# Patient Record
Sex: Male | Born: 1940 | Race: White | Hispanic: No | Marital: Married | Smoking: Former smoker
Health system: Southern US, Community
[De-identification: ages and names within clinical notes are randomized; demographics above are authoritative.]

## PROBLEM LIST (undated history)

## (undated) DIAGNOSIS — S060X9A Concussion with loss of consciousness of unspecified duration, initial encounter: Secondary | ICD-10-CM

## (undated) DIAGNOSIS — G4730 Sleep apnea, unspecified: Secondary | ICD-10-CM

## (undated) DIAGNOSIS — R519 Headache, unspecified: Secondary | ICD-10-CM

## (undated) DIAGNOSIS — Z8719 Personal history of other diseases of the digestive system: Secondary | ICD-10-CM

## (undated) DIAGNOSIS — J189 Pneumonia, unspecified organism: Secondary | ICD-10-CM

## (undated) DIAGNOSIS — R51 Headache: Secondary | ICD-10-CM

## (undated) DIAGNOSIS — C801 Malignant (primary) neoplasm, unspecified: Secondary | ICD-10-CM

## (undated) DIAGNOSIS — I10 Essential (primary) hypertension: Secondary | ICD-10-CM

## (undated) DIAGNOSIS — K219 Gastro-esophageal reflux disease without esophagitis: Secondary | ICD-10-CM

## (undated) HISTORY — PX: COLON SURGERY: SHX602

---

## 1972-09-17 DIAGNOSIS — S060X1A Concussion with loss of consciousness of 30 minutes or less, initial encounter: Secondary | ICD-10-CM

## 1972-09-17 DIAGNOSIS — S060X9A Concussion with loss of consciousness of unspecified duration, initial encounter: Secondary | ICD-10-CM

## 1972-09-17 HISTORY — DX: Concussion with loss of consciousness of 30 minutes or less, initial encounter: S06.0X1A

## 1972-09-17 HISTORY — DX: Concussion with loss of consciousness of unspecified duration, initial encounter: S06.0X9A

## 2014-09-22 ENCOUNTER — Inpatient Hospital Stay (HOSPITAL_COMMUNITY)
Admission: EM | Admit: 2014-09-22 | Discharge: 2014-09-24 | DRG: 864 | Disposition: A | Discharge status: Home / Self Care | Payer: Medicare Other | Attending: Internal Medicine | Admitting: Internal Medicine

## 2014-09-22 ENCOUNTER — Emergency Department (HOSPITAL_COMMUNITY): Payer: Medicare Other

## 2014-09-22 ENCOUNTER — Encounter (HOSPITAL_COMMUNITY): Payer: Self-pay

## 2014-09-22 ENCOUNTER — Inpatient Hospital Stay (HOSPITAL_COMMUNITY): Payer: Medicare Other

## 2014-09-22 DIAGNOSIS — R6510 Systemic inflammatory response syndrome (SIRS) of non-infectious origin without acute organ dysfunction: Secondary | ICD-10-CM | POA: Diagnosis present

## 2014-09-22 DIAGNOSIS — N400 Enlarged prostate without lower urinary tract symptoms: Secondary | ICD-10-CM | POA: Diagnosis present

## 2014-09-22 DIAGNOSIS — Z7982 Long term (current) use of aspirin: Secondary | ICD-10-CM | POA: Diagnosis not present

## 2014-09-22 DIAGNOSIS — R5383 Other fatigue: Secondary | ICD-10-CM

## 2014-09-22 DIAGNOSIS — Z87891 Personal history of nicotine dependence: Secondary | ICD-10-CM

## 2014-09-22 DIAGNOSIS — G4730 Sleep apnea, unspecified: Secondary | ICD-10-CM | POA: Diagnosis present

## 2014-09-22 DIAGNOSIS — G8220 Paraplegia, unspecified: Secondary | ICD-10-CM | POA: Diagnosis present

## 2014-09-22 DIAGNOSIS — Z859 Personal history of malignant neoplasm, unspecified: Secondary | ICD-10-CM

## 2014-09-22 DIAGNOSIS — G9340 Encephalopathy, unspecified: Secondary | ICD-10-CM | POA: Diagnosis present

## 2014-09-22 DIAGNOSIS — I10 Essential (primary) hypertension: Secondary | ICD-10-CM | POA: Diagnosis present

## 2014-09-22 DIAGNOSIS — R509 Fever, unspecified: Principal | ICD-10-CM | POA: Diagnosis present

## 2014-09-22 DIAGNOSIS — A419 Sepsis, unspecified organism: Secondary | ICD-10-CM

## 2014-09-22 DIAGNOSIS — K219 Gastro-esophageal reflux disease without esophagitis: Secondary | ICD-10-CM | POA: Diagnosis present

## 2014-09-22 DIAGNOSIS — D72829 Elevated white blood cell count, unspecified: Secondary | ICD-10-CM

## 2014-09-22 DIAGNOSIS — R05 Cough: Secondary | ICD-10-CM | POA: Diagnosis present

## 2014-09-22 DIAGNOSIS — E780 Pure hypercholesterolemia: Secondary | ICD-10-CM | POA: Diagnosis present

## 2014-09-22 DIAGNOSIS — G039 Meningitis, unspecified: Secondary | ICD-10-CM | POA: Diagnosis present

## 2014-09-22 DIAGNOSIS — R410 Disorientation, unspecified: Secondary | ICD-10-CM

## 2014-09-22 HISTORY — DX: Pneumonia, unspecified organism: J18.9

## 2014-09-22 HISTORY — DX: Malignant (primary) neoplasm, unspecified: C80.1

## 2014-09-22 HISTORY — DX: Personal history of other diseases of the digestive system: Z87.19

## 2014-09-22 HISTORY — DX: Essential (primary) hypertension: I10

## 2014-09-22 HISTORY — DX: Headache, unspecified: R51.9

## 2014-09-22 HISTORY — DX: Concussion with loss of consciousness of unspecified duration, initial encounter: S06.0X9A

## 2014-09-22 HISTORY — DX: Gastro-esophageal reflux disease without esophagitis: K21.9

## 2014-09-22 HISTORY — DX: Headache: R51

## 2014-09-22 HISTORY — DX: Sleep apnea, unspecified: G47.30

## 2014-09-22 LAB — COMPREHENSIVE METABOLIC PANEL
ALBUMIN: 3.8 g/dL (ref 3.5–5.2)
ALT: 18 U/L (ref 0–53)
AST: 23 U/L (ref 0–37)
Alkaline Phosphatase: 68 U/L (ref 39–117)
Anion gap: 9 (ref 5–15)
BUN: 18 mg/dL (ref 6–23)
CALCIUM: 9 mg/dL (ref 8.4–10.5)
CHLORIDE: 103 meq/L (ref 96–112)
CO2: 24 mmol/L (ref 19–32)
CREATININE: 0.92 mg/dL (ref 0.50–1.35)
GFR calc Af Amer: >90 mL/min (ref >90)
GFR, EST NON AFRICAN AMERICAN: 80 mL/min — AB (ref >90)
GLUCOSE: 107 mg/dL — AB (ref 70–99)
Potassium: 4.1 mmol/L (ref 3.5–5.1)
Sodium: 136 mmol/L (ref 135–145)
Total Bilirubin: 1.7 mg/dL — ABNORMAL HIGH (ref 0.3–1.2)
Total Protein: 6.3 g/dL (ref 6.0–8.3)

## 2014-09-22 LAB — DIFFERENTIAL
BASOS ABS: 0 10*3/uL (ref 0.0–0.1)
Basophils Relative: 0 % (ref 0–1)
EOS PCT: 0 % (ref 0–5)
Eosinophils Absolute: 0 10*3/uL (ref 0.0–0.7)
LYMPHS PCT: 4 % — AB (ref 12–46)
Lymphs Abs: 1 10*3/uL (ref 0.7–4.0)
MONOS PCT: 9 % (ref 3–12)
Monocytes Absolute: 2 10*3/uL — ABNORMAL HIGH (ref 0.1–1.0)
NEUTROS ABS: 20 10*3/uL — AB (ref 1.7–7.7)
Neutrophils Relative %: 87 % — ABNORMAL HIGH (ref 43–77)

## 2014-09-22 LAB — URINALYSIS, ROUTINE W REFLEX MICROSCOPIC
BILIRUBIN URINE: NEGATIVE
Glucose, UA: NEGATIVE mg/dL
Ketones, ur: NEGATIVE mg/dL
Leukocytes, UA: NEGATIVE
NITRITE: NEGATIVE
Protein, ur: NEGATIVE mg/dL
SPECIFIC GRAVITY, URINE: 1.025 (ref 1.005–1.030)
Urobilinogen, UA: 1 mg/dL (ref 0.0–1.0)
pH: 5.5 (ref 5.0–8.0)

## 2014-09-22 LAB — CBG MONITORING, ED: Glucose-Capillary: 97 mg/dL (ref 70–99)

## 2014-09-22 LAB — CSF CELL COUNT WITH DIFFERENTIAL
RBC COUNT CSF: 4 /mm3 — AB
Tube #: 1
WBC, CSF: 2 /mm3 (ref 0–5)

## 2014-09-22 LAB — I-STAT CG4 LACTIC ACID, ED: Lactic Acid, Venous: 0.97 mmol/L (ref 0.5–2.2)

## 2014-09-22 LAB — CBC
HCT: 46.4 % (ref 39.0–52.0)
HEMOGLOBIN: 15.7 g/dL (ref 13.0–17.0)
MCH: 33 pg (ref 26.0–34.0)
MCHC: 33.8 g/dL (ref 30.0–36.0)
MCV: 97.5 fL (ref 78.0–100.0)
PLATELETS: 185 10*3/uL (ref 150–400)
RBC: 4.76 MIL/uL (ref 4.22–5.81)
RDW: 14.3 % (ref 11.5–15.5)
WBC: 23 10*3/uL — AB (ref 4.0–10.5)

## 2014-09-22 LAB — ETHANOL

## 2014-09-22 LAB — PROTIME-INR
INR: 1.24 (ref 0.00–1.49)
Prothrombin Time: 15.7 seconds — ABNORMAL HIGH (ref 11.6–15.2)

## 2014-09-22 LAB — RAPID URINE DRUG SCREEN, HOSP PERFORMED
Amphetamines: NOT DETECTED
BARBITURATES: NOT DETECTED
Benzodiazepines: NOT DETECTED
Cocaine: NOT DETECTED
Opiates: NOT DETECTED
TETRAHYDROCANNABINOL: NOT DETECTED

## 2014-09-22 LAB — I-STAT TROPONIN, ED: Troponin i, poc: 0 ng/mL (ref 0.00–0.08)

## 2014-09-22 LAB — GLUCOSE, CSF: Glucose, CSF: 69 mg/dL (ref 43–76)

## 2014-09-22 LAB — APTT: aPTT: 33 seconds (ref 24–37)

## 2014-09-22 LAB — URINE MICROSCOPIC-ADD ON

## 2014-09-22 LAB — PROTEIN, CSF: TOTAL PROTEIN, CSF: 26 mg/dL (ref 15–45)

## 2014-09-22 MED ORDER — HEPARIN SODIUM (PORCINE) 5000 UNIT/ML IJ SOLN
5000.0000 [IU] | Freq: Three times a day (TID) | INTRAMUSCULAR | Status: DC
  Administered 2014-09-22 – 2014-09-24 (×5): 5000 [IU] via SUBCUTANEOUS
  Filled 2014-09-22 (×9): qty 1

## 2014-09-22 MED ORDER — VANCOMYCIN HCL IN DEXTROSE 1-5 GM/200ML-% IV SOLN: 1000.0000 mg | Freq: Once | INTRAVENOUS | Status: DC

## 2014-09-22 MED ORDER — ACETAMINOPHEN 325 MG PO TABS
650.0000 mg | ORAL_TABLET | Freq: Once | ORAL | Status: AC
  Administered 2014-09-22: 650 mg via ORAL
  Filled 2014-09-22: qty 2

## 2014-09-22 MED ORDER — PIPERACILLIN-TAZOBACTAM 3.375 G IVPB 30 MIN: 3.3750 g | INTRAVENOUS | Status: DC

## 2014-09-22 MED ORDER — SODIUM CHLORIDE 0.9 % IV SOLN
INTRAVENOUS | Status: DC
  Administered 2014-09-22 – 2014-09-23 (×3): via INTRAVENOUS

## 2014-09-22 MED ORDER — MORPHINE SULFATE 2 MG/ML IJ SOLN: 2.0000 mg | INTRAMUSCULAR | Status: DC | PRN

## 2014-09-22 MED ORDER — BACID PO TABS
2.0000 | ORAL_TABLET | Freq: Two times a day (BID) | ORAL | Status: DC
  Administered 2014-09-22: 2 via ORAL
  Filled 2014-09-22 (×5): qty 2

## 2014-09-22 MED ORDER — ACETAMINOPHEN 325 MG PO TABS
650.0000 mg | ORAL_TABLET | Freq: Four times a day (QID) | ORAL | Status: DC | PRN
  Administered 2014-09-22: 650 mg via ORAL
  Filled 2014-09-22: qty 2

## 2014-09-22 MED ORDER — SODIUM CHLORIDE 0.9 % IV SOLN
INTRAVENOUS | Status: DC
  Administered 2014-09-22: 07:00:00 via INTRAVENOUS

## 2014-09-22 MED ORDER — VANCOMYCIN HCL IN DEXTROSE 750-5 MG/150ML-% IV SOLN
750.0000 mg | Freq: Two times a day (BID) | INTRAVENOUS | Status: DC
  Administered 2014-09-23: 750 mg via INTRAVENOUS
  Filled 2014-09-22 (×2): qty 150

## 2014-09-22 MED ORDER — SODIUM CHLORIDE 0.9 % IV SOLN
2000.0000 mg | Freq: Once | INTRAVENOUS | Status: AC
  Administered 2014-09-22: 2000 mg via INTRAVENOUS
  Filled 2014-09-22: qty 2000

## 2014-09-22 MED ORDER — ALFUZOSIN HCL ER 10 MG PO TB24
10.0000 mg | ORAL_TABLET | Freq: Every day | ORAL | Status: DC
  Administered 2014-09-22 – 2014-09-23 (×2): 10 mg via ORAL
  Filled 2014-09-22 (×3): qty 1

## 2014-09-22 MED ORDER — INFLUENZA VAC SPLIT QUAD 0.5 ML IM SUSY
0.5000 mL | PREFILLED_SYRINGE | INTRAMUSCULAR | Status: AC
  Administered 2014-09-23: 0.5 mL via INTRAMUSCULAR
  Filled 2014-09-22 (×2): qty 0.5

## 2014-09-22 MED ORDER — PIPERACILLIN-TAZOBACTAM 3.375 G IVPB 30 MIN
3.3750 g | Freq: Once | INTRAVENOUS | Status: DC
  Filled 2014-09-22: qty 50

## 2014-09-22 MED ORDER — ATORVASTATIN CALCIUM 20 MG PO TABS
20.0000 mg | ORAL_TABLET | Freq: Every day | ORAL | Status: DC
  Administered 2014-09-22 – 2014-09-23 (×2): 20 mg via ORAL
  Filled 2014-09-22 (×3): qty 1

## 2014-09-22 MED ORDER — ASPIRIN 81 MG PO CHEW
81.0000 mg | CHEWABLE_TABLET | Freq: Every day | ORAL | Status: DC
  Administered 2014-09-22 – 2014-09-23 (×2): 81 mg via ORAL
  Filled 2014-09-22 (×3): qty 1

## 2014-09-22 MED ORDER — SODIUM CHLORIDE 0.9 % IJ SOLN: 3.0000 mL | Freq: Two times a day (BID) | INTRAMUSCULAR | Status: DC

## 2014-09-22 MED ORDER — ACETAMINOPHEN 650 MG RE SUPP: 650.0000 mg | Freq: Four times a day (QID) | RECTAL | Status: DC | PRN

## 2014-09-22 MED ORDER — ONDANSETRON HCL 4 MG PO TABS: 4.0000 mg | ORAL_TABLET | Freq: Four times a day (QID) | ORAL | Status: DC | PRN

## 2014-09-22 MED ORDER — SODIUM CHLORIDE 0.9 % IV BOLUS (SEPSIS)
500.0000 mL | Freq: Once | INTRAVENOUS | Status: AC
  Administered 2014-09-22: 500 mL via INTRAVENOUS

## 2014-09-22 MED ORDER — ONDANSETRON HCL 4 MG/2ML IJ SOLN
4.0000 mg | Freq: Four times a day (QID) | INTRAMUSCULAR | Status: DC | PRN
  Administered 2014-09-22: 4 mg via INTRAVENOUS
  Filled 2014-09-22: qty 2

## 2014-09-22 MED ORDER — PANTOPRAZOLE SODIUM 40 MG PO TBEC
40.0000 mg | DELAYED_RELEASE_TABLET | Freq: Every day | ORAL | Status: DC
  Administered 2014-09-23 – 2014-09-24 (×2): 40 mg via ORAL
  Filled 2014-09-22 (×2): qty 1

## 2014-09-22 MED ORDER — PIPERACILLIN-TAZOBACTAM 3.375 G IVPB
3.3750 g | Freq: Three times a day (TID) | INTRAVENOUS | Status: DC
  Administered 2014-09-22 – 2014-09-24 (×7): 3.375 g via INTRAVENOUS
  Filled 2014-09-22 (×8): qty 50

## 2014-09-22 NOTE — ED Notes (Signed)
Patient from [place] Patient driving to [facility] from [place] Patient's family brought patient in via private car due to [place] Patient has not been seen in over two weeks Family member states that patient had slurred speech, so they brought him in

## 2014-09-22 NOTE — ED Notes (Signed)
Patient found standing in room, beside bed.  When asked what he was doing, he replied, "I had to throw up."  Moderate amount of green/yellow emesis noted on linens and gown.  Denies nausea at this time.  Patient cleaned and redirected toward bed.  Reoriented to call light.

## 2014-09-22 NOTE — Progress Notes (Signed)
UR completed 

## 2014-09-22 NOTE — ED Notes (Signed)
Pt reports was coming to visit brothers and became disoriented. Pt reports here to figure out what is wrong.

## 2014-09-22 NOTE — ED Provider Notes (Signed)
CSN: [id number]     Arrival date & time 09/22/14  0246 History   First MD Initiated Contact with Patient 09/22/14 0251     Chief Complaint  Patient presents with  . Altered Mental Status  . Fever     (Consider location/radiation/quality/duration/timing/severity/associated sxs/prior Treatment) HPI Patient presents with confusion. According to patient's brother, he was called by [facility] police after being found in the parking lot of a [employer]. He thought that he was at a hotel. He is noted to have mild slurred speech. Recently drove down from [place]. Patient's brother states he saw the patient several weeks ago and he was acting normally. No history of confusion. Patient denies any pain at this time. Specifically no chest pain or abdominal pain. He has no urinary symptoms. No nausea, vomiting or diarrhea. No neck pain or stiffness. Patient has no focal weakness or numbness. Past Medical History  Diagnosis Date  . Hypertension   . Pneumonia   . GERD (gastroesophageal reflux disease)   . History of hiatal hernia   . Headache   . Cancer     colon cancer  . Sleep apnea     uses cpap   Past Surgical History  Procedure Laterality Date  . Colon surgery     History reviewed. No pertinent family history. History  Substance Use Topics  . Smoking status: Former Smoker    Types: Cigars    Quit date: 09/17/1993  . Smokeless tobacco: Not on file  . Alcohol Use: 34.8 oz/week    58 Shots of liquor per week    Review of Systems  Constitutional: Positive for fever.  Respiratory: Negative for shortness of breath.   Cardiovascular: Negative for chest pain.  Gastrointestinal: Negative for nausea, vomiting and abdominal pain.  Genitourinary: Negative for dysuria and flank pain.  Musculoskeletal: Negative for back pain, neck pain and neck stiffness.  Skin: Negative for rash and wound.  Neurological: Negative for dizziness, weakness, light-headedness, numbness and headaches.   Psychiatric/Behavioral: Positive for confusion.  All other systems reviewed and are negative.     Allergies  Review of patient's allergies indicates no known allergies.  Home Medications   Prior to Admission medications   Medication Sig Start Date End Date Taking? Authorizing Provider  alfuzosin (UROXATRAL) 10 MG 24 hr tablet Take 10 mg by mouth at bedtime.   Yes Historical Provider, MD  Armodafinil (NUVIGIL) 150 MG tablet Take 150 mg by mouth at bedtime.   Yes Historical Provider, MD  aspirin 325 MG tablet Take 325 mg by mouth once.   Yes Historical Provider, MD  aspirin 81 MG chewable tablet Chew 81 mg by mouth at bedtime.   Yes Historical Provider, MD  atorvastatin (LIPITOR) 20 MG tablet Take 20 mg by mouth at bedtime.   Yes Historical Provider, MD  azelastine (ASTELIN) 0.1 % nasal spray Place 2 sprays into both nostrils 2 (two) times daily. Use in each nostril as directed   Yes Historical Provider, MD  dutasteride (AVODART) 0.5 MG capsule Take 0.5 mg by mouth at bedtime.   Yes Historical Provider, MD  esomeprazole (NEXIUM) 40 MG capsule Take 40 mg by mouth daily at 12 noon.   Yes Historical Provider, MD  lactobacillus acidophilus (BACID) TABS tablet Take 2 tablets by mouth 2 (two) times daily.   Yes Historical Provider, MD  LECITHIN PO Take 1 tablet by mouth daily.   Yes Historical Provider, MD  montelukast (SINGULAIR) 10 MG tablet Take 10 mg by mouth at bedtime.  Yes Historical Provider, MD  Multiple Vitamin (MULTIVITAMIN WITH MINERALS) TABS tablet Take 1 tablet by mouth daily.   Yes Historical Provider, MD  nortriptyline (PAMELOR) 50 MG capsule Take 50 mg by mouth at bedtime.   Yes Historical Provider, MD  omega-3 acid ethyl esters (LOVAZA) 1 G capsule Take 1 g by mouth daily.   Yes Historical Provider, MD  OVER THE COUNTER MEDICATION Take 1 tablet by mouth daily. Force factor   Yes Historical Provider, MD  polycarbophil (FIBERCON) 625 MG tablet Take 625 mg by mouth daily.   Yes  Historical Provider, MD  Pseudoephedrine-APAP-DM (DAYQUIL PO) Take 2 capsules by mouth every 4 (four) hours as needed (cold symptoms).   Yes Historical Provider, MD  rOPINIRole (REQUIP) 1 MG tablet Take 1 mg by mouth at bedtime.   Yes Historical Provider, MD  sildenafil (VIAGRA) 100 MG tablet Take 100 mg by mouth daily as needed for erectile dysfunction.   Yes Historical Provider, MD  testosterone cypionate (DEPOTESTOTERONE CYPIONATE) 100 MG/ML injection Inject 200 mg into the muscle every 7 (seven) days. On Sat. For IM use only   Yes Historical Provider, MD  vitamin B-12 (CYANOCOBALAMIN) 1000 MCG tablet Take 1,000 mcg by mouth daily.   Yes Historical Provider, MD   BP 100/53 mmHg  Pulse 59  Temp(Src) 97.7 F (36.5 C) (Oral)  Resp 20  Ht 6\' 2"  (1.88 m)  Wt 227 lb (102.967 kg)  BMI 29.13 kg/m2  SpO2 91% Physical Exam  Constitutional: He is oriented to person, place, and time. He appears well-developed and well-nourished. No distress.  HENT:  Head: Normocephalic and atraumatic.  Mouth/Throat: Oropharynx is clear and moist. No oropharyngeal exudate.  Eyes: EOM are normal. Pupils are equal, round, and reactive to light.  Neck: Normal range of motion. Neck supple.  No meningismus  Cardiovascular: Normal rate and regular rhythm.  Exam reveals no gallop and no friction rub.   No murmur heard. Pulmonary/Chest: Effort normal and breath sounds normal. No respiratory distress. He has no wheezes. He has no rales. He exhibits no tenderness.  Abdominal: Soft. Bowel sounds are normal. He exhibits no distension and no mass. There is no tenderness. There is no rebound and no guarding.  Musculoskeletal: Normal range of motion. He exhibits no edema or tenderness.  Neurological: He is alert and oriented to person, place, and time.  Patient is alert and oriented x3 with clear, goal oriented speech. Patient has 5/5 motor in all extremities. Sensation is intact to light touch.   Skin: Skin is warm and dry.  No rash noted. No erythema.  Psychiatric: He has a normal mood and affect.  Patient with tangential speech pattern.   Nursing note and vitals reviewed.   ED Course  LUMBAR PUNCTURE Date/Time: 09/22/2014 5:42 AM Performed by: Julianne Rice Authorized by: Julianne Rice Consent: Written consent obtained. Risks and benefits: risks, benefits and alternatives were discussed Time out: Immediately prior to procedure a "time out" was called to verify the correct patient, procedure, equipment, support staff and site/side marked as required. Indications: evaluation for infection and evaluation for altered mental status Local anesthetic: lidocaine 1% without epinephrine Anesthetic total: 4 ml Patient sedated: no Lumbar space: L3-L4 interspace Patient's position: left lateral decubitus Needle gauge: 20 Needle type: spinal needle - Quincke tip Needle length: 5.0 in Number of attempts: 3 Post-procedure: site cleaned and adhesive bandage applied Patient tolerance: Patient tolerated the procedure well with no immediate complications Comments: Landmarks difficult to palpate. Unsuccessful lumbar puncture   (  including critical care time) Labs Review Labs Reviewed  PROTIME-INR - Abnormal; Notable for the following:    Prothrombin Time 15.7 (*)    All other components within normal limits  CBC - Abnormal; Notable for the following:    WBC 23.0 (*)    All other components within normal limits  DIFFERENTIAL - Abnormal; Notable for the following:    Neutrophils Relative % 87 (*)    Neutro Abs 20.0 (*)    Lymphocytes Relative 4 (*)    Monocytes Absolute 2.0 (*)    All other components within normal limits  COMPREHENSIVE METABOLIC PANEL - Abnormal; Notable for the following:    Glucose, Bld 107 (*)    Total Bilirubin 1.7 (*)    GFR calc non Af Amer 80 (*)    All other components within normal limits  URINALYSIS, ROUTINE W REFLEX MICROSCOPIC - Abnormal; Notable for the following:    Color,  Urine AMBER (*)    Hgb urine dipstick SMALL (*)    All other components within normal limits  CSF CELL COUNT WITH DIFFERENTIAL - Abnormal; Notable for the following:    Color, CSF CLEAR (*)    Appearance, CSF COLORLESS (*)    RBC Count, CSF 4 (*)    All other components within normal limits  COMPREHENSIVE METABOLIC PANEL - Abnormal; Notable for the following:    Glucose, Bld 150 (*)    BUN 29 (*)    Calcium 8.3 (*)    Total Protein 5.3 (*)    Albumin 3.1 (*)    Total Bilirubin 1.7 (*)    GFR calc non Af Amer 62 (*)    GFR calc Af Amer 72 (*)    All other components within normal limits  CBC - Abnormal; Notable for the following:    WBC 16.3 (*)    RBC 4.10 (*)    Platelets 148 (*)    All other components within normal limits  CSF CULTURE  CULTURE, BLOOD (ROUTINE X 2)  CULTURE, BLOOD (ROUTINE X 2)  URINE CULTURE  CSF CULTURE  GRAM STAIN  ETHANOL  APTT  URINE RAPID DRUG SCREEN (HOSP PERFORMED)  URINE MICROSCOPIC-ADD ON  GLUCOSE, CSF  PROTEIN, CSF  HERPES SIMPLEX VIRUS(HSV) DNA BY PCR  CSF CELL COUNT WITH DIFFERENTIAL  INFLUENZA PANEL BY PCR (TYPE A & B, H1N1)  CBG MONITORING, ED  I-STAT TROPOININ, ED  I-STAT CG4 LACTIC ACID, ED    Imaging Review Ct Head Wo Contrast  09/22/2014   CLINICAL DATA:  Acute onset of altered mental status. Slurred speech. Code stroke. Initial encounter.  EXAM: CT HEAD WITHOUT CONTRAST  TECHNIQUE: Contiguous axial images were obtained from the base of the skull through the vertex without intravenous contrast.  COMPARISON:  None.  FINDINGS: There is no evidence of acute infarction, mass lesion, or intra- or extra-axial hemorrhage on CT.  Prominence of the ventricles and sulci reflects mild to moderate cortical volume loss. Scattered periventricular and subcortical white matter change likely reflects small vessel ischemic microangiopathy. Cerebellar atrophy is noted.  The brainstem and fourth ventricle are within normal limits. The basal ganglia are  unremarkable in appearance. The cerebral hemispheres demonstrate grossly normal gray-white differentiation. No mass effect or midline shift is seen.  There is no evidence of fracture; visualized osseous structures are unremarkable in appearance. The orbits are within normal limits. The paranasal sinuses and mastoid air cells are well-aerated. No significant soft tissue abnormalities are seen.  IMPRESSION: 1. No acute intracranial  pathology seen on CT. 2. Mild to moderate cortical volume loss and scattered small vessel ischemic microangiopathy.  These results were called by telephone at the time of interpretation on 09/22/2014 at 3:35 am to Dr. Julianne Rice, who verbally acknowledged these results.   Electronically Signed   By: Garald Balding M.D.   On: 09/22/2014 03:35   Mr Brain Wo Contrast  09/22/2014   CLINICAL DATA:  Acute encephalopathy. Sudden onset of confusion and slurred speech.  EXAM: MRI HEAD WITHOUT CONTRAST  TECHNIQUE: Multiplanar, multiecho pulse sequences of the brain and surrounding structures were obtained without intravenous contrast.  COMPARISON:  Head CT same day  FINDINGS: Diffusion imaging does not show any acute or subacute infarction. There chronic small-vessel ischemic changes throughout the pons. No focal cerebellar insult. 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 There are moderate chronic small-vessel ischemic changes affecting the deep and subcortical white matter. No cortical or large vessel territory infarction. No mass lesion, hemorrhage, hydrocephalus or extra-axial collection. No pituitary mass. No significant sinus disease. No skull or skullbase lesion. Major vessels at the base of the brain show flow.  IMPRESSION: No acute finding. 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   Electronically Signed   By: Nelson Chimes M.D.   On: 09/22/2014 07:29   US Abdomen  Port  09/22/2014   CLINICAL DATA:  Hyperbilirubinemia.  EXAM: US ABDOMEN LIMITED - RIGHT UPPER QUADRANT  COMPARISON:  None.  FINDINGS: Gallbladder:  No gallstones or wall thickening visualized. No sonographic Murphy sign noted.  Common bile duct:  Diameter: 5 mm  Liver:  Small 1.2 cm hypoechoic region is noted within the left lobe likely representing a small cyst. Mild increased echogenicity is noted which may be related to fatty infiltration.  IMPRESSION: Likely small hepatic cysts.  Fatty infiltration.  No other focal abnormality is seen.   Electronically Signed   By: Inez Catalina M.D.   On: 09/22/2014 15:19   Dg Chest Port 1 View  09/22/2014   CLINICAL DATA:  Fever.  Altered mental status.  EXAM: PORTABLE CHEST - 1 VIEW  COMPARISON:  None.  FINDINGS: The heart size and mediastinal contours are within normal limits. Both lungs are clear. The visualized skeletal structures are unremarkable.  IMPRESSION: No active disease.   Electronically Signed   By: Lucienne Capers M.D.   On: 09/22/2014 04:09   Dg Lumbar Puncture Fluoro Guide  09/22/2014   CLINICAL DATA:  Fever  EXAM: DIAGNOSTIC LUMBAR PUNCTURE UNDER FLUOROSCOPIC GUIDANCE  FLUOROSCOPY TIME:  12 seconds  PROCEDURE: Informed consent was obtained from the patient prior to the procedure, including potential complications of headache, allergy, and pain. With the patient prone, the lower back was prepped with Betadine. 1% Lidocaine was used for local anesthesia. Lumbar puncture was performed at the L3-4 level using a 22 gauge needle with return of clear CSF with an opening pressure of 23 cm water. 10.50ml of CSF were obtained for laboratory studies. The patient tolerated the procedure well and there were no apparent complications.  IMPRESSION: Successful fluoroscopic guided lumbar puncture, as above.   Electronically Signed   By: Julian Hy M.D.   On: 09/22/2014 13:14     EKG Interpretation None      Date: 09/22/2014  Rate: 94  Rhythm: normal sinus  rhythm  QRS Axis: normal  Intervals: normal  ST/T Wave abnormalities: normal  Conduction Disutrbances:none  Narrative Interpretation:  Old EKG Reviewed: none available   MDM   Final diagnoses:  Fever  Disorientation   Patient with confusion and fever. No source for fever found. Initially febrile fever resolved spontaneously. Heart rate and blood pressure stable. Lactic acid is normal. Elevation in white blood cells. Discussed with Dr. Doy Mince. Recommends LP and MRI.  Unsuccessful LP attempt in the ED. Dr. Ernestina Patches will admit patient. Recommends holding antibiotics until LP performed under fluoroscopy. Patient is alert and oriented. Confusion appears to have resolved. We'll consent for LP and attempt in the ED.    Julianne Rice, MD 09/23/14 [phone]

## 2014-09-22 NOTE — Progress Notes (Signed)
Pt's rectal temp 103.8.  MD notified.  Tylenol given.  Retook at 1900, temp 101.2.  Will continue to monitor pt. Closely.

## 2014-09-22 NOTE — Progress Notes (Signed)
ANTIBIOTIC CONSULT NOTE - INITIAL  Pharmacy Consult for Zosyn, vancomycin Indication: sepsis, r/o CNS infection  No Known Allergies  Patient Measurements: Height: 6\' 2"  (188 cm) Weight: 227 lb (102.967 kg) IBW/kg (Calculated) : 82.2   Vital Signs: Temp: 102 F (38.9 C) (01/06 0620) Temp Source: Rectal (01/06 0620) BP: 108/57 mmHg (01/06 0620) Pulse Rate: 90 (01/06 0620) Intake/Output from previous day:   Intake/Output from this shift:    Labs:  Recent Labs  09/22/14 0303  WBC 23.0*  HGB 15.7  PLT 185  CREATININE 0.92   Estimated Creatinine Clearance: 88.8 mL/min (by C-G formula based on Cr of 0.92). No results for input(s): VANCOTROUGH, VANCOPEAK, VANCORANDOM, GENTTROUGH, GENTPEAK, GENTRANDOM, TOBRATROUGH, TOBRAPEAK, TOBRARND, AMIKACINPEAK, AMIKACINTROU, AMIKACIN in the last 72 hours.   Medical History: No past medical history on file.  Microbiology: 1/6 blood x2: collected 1/6 urine: collected  Anti-infectives: 1/6 >> Zosyn >> 1/6 >> vancomycin >>   Assessment: 74 y/o M found confused walking in [place] parking lot with slurred speech and T 102.3.   WBC 23K but CXR and UA not suggestive of infection, head CT WNL.  Neuro recommends LP and MRI.  EDP placed on empiric Zosyn and vanco with pharmacy dosing assistance requested, then called pharmacy and asked that first doses be delayed until after LP.  Goal of Therapy:  Vancomycin trough 15-20 Appropriate antibiotic dosing for indication and renal function; eradication of infection.   Plan:  1. Zosyn 3.375 grams IV x 1 over 30 mins after LP, then 3.375 grams IV q8h by extended infusion (each dose over 4 hours). 2. Vancomycin 2 grams IV x 1 after LP, then 750 mg IV q12h 3. Follow renal function, culture results, clinical course. 4. Check vancomycin trough at steady-state.  Clayburn Pert, PharmD, BCPS Pager: [phone] 09/22/2014  6:41 AM

## 2014-09-22 NOTE — Evaluation (Signed)
EDP: [place] CC: Encephalopathy, sepsis   Pt w/ acute encephalopathy. Found in [place] parking lot in [place] confused. Pt thought that he was at hotel. Had noted slurred speech. Originally from [place]. Was noted to be normal by family 2 weeks ago per report.  T 102.3 on presentation-defervesced w/o antipyretics.  WBC 23.  Hemodynamically stable  UA and CXR negative for infection.  Head CT WNL  Exam/neuro exam WNL apart from intermitttent tangential speech per EDP Case discussed w/ neuro-rec LP and MRI  Defer neuro sepsis coverage pending imaging and CSF studies Placed on vanc and zosyn empirically  Pan culture

## 2014-09-22 NOTE — ED Notes (Signed)
Family took home 3 bags of pt belongings.

## 2014-09-22 NOTE — ED Notes (Signed)
Patient is alert and oriented x 4 Patient does not have slurred speech and this time Patient will fall asleep during assessment, but is aroused with verbal and tactile stimuli

## 2014-09-22 NOTE — ED Notes (Signed)
Bedside report received from previous RN, Abbie.

## 2014-09-22 NOTE — ED Notes (Addendum)
Spoke with Dr. Ernestina Patches. He would like the ABX held until after the lumbar puncture is completed.

## 2014-09-22 NOTE — Consult Note (Addendum)
Neurology Consultation Reason for Consult: Altered Mental Status Referring Physician: Rhetta Mura  CC: Altered Mental Status  History is obtained from:Patient  HPI: Tanner Patton is a 74 y.o. male who was found confused in a [employer] parking lot. He thought he was ain a hotel. He was found to be febrile and brought to the ER. On my discussion with him, he states that his son had recently been ill, but then became very tangential so I am not certain of the trustworthiness of this information.       ROS:  Unable to obtain due to altered mental status.   PMH: High cholesterol.  Limited due to confabulation  Family History: Son - tetraplegia  Social History: Tob: denies  Exam: Current vital signs: BP 100/56 mmHg  Pulse 95  Temp(Src) 97.8 F (36.6 C) (Oral)  Resp 18  Ht 6\' 2"  (1.88 m)  Wt 102.967 kg (227 lb)  BMI 29.13 kg/m2  SpO2 98% Vital signs in last 24 hours: Temp:  [97.8 F (36.6 C)-102.3 F (39.1 C)] 97.8 F (36.6 C) (01/06 1335) Pulse Rate:  [71-97] 95 (01/06 1335) Resp:  [10-28] 18 (01/06 1335) BP: (90-131)/(40-106) 100/56 mmHg (01/06 1335) SpO2:  [93 %-98 %] 98 % (01/06 1335) Weight:  [102.967 kg (227 lb)] 102.967 kg (227 lb) (01/06 6967)   Physical Exam  Constitutional: appears anxious, shivering Psych: Affect appropriate to situation Eyes: No scleral injection HENT: No OP obstrucion Head: Normocephalic.  Cardiovascular: Normal rate and regular rhythm.  Respiratory: Effort normal and breath sounds normal to anterior ascultation GI: Soft.  No distension. There is no tenderness.  Skin: WDI  Neuro: Mental Status: Patient is awake, alert, oriented to person, month, year, does not know he is in [place]. Confabulates.  No signs of aphasia or neglect Cranial Nerves: II: Visual Fields are full. Pupils are equal, round, and reactive to light.   III,IV, VI: EOMI without ptosis or diploplia.  V: Facial sensation is symmetric to temperature VII:  Facial movement is symmetric.  VIII: hearing is intact to voice X: Uvula elevates symmetrically XI: Shoulder shrug is symmetric. XII: tongue is midline without atrophy or fasciculations.  Motor: Tone is normal. Bulk is normal. 5/5 strength was present in all four extremities.  Sensory: Sensation is symmetric to light touch and temperature in the arms and legs. Deep Tendon Reflexes: 2+ and symmetric in the biceps and patellae.  Cerebellar: FNF and HKS are intact bilaterally    I have reviewed labs in epic and the results pertinent to this consultation are: cmp - mildly elevated t. bili  I have reviewed the images obtained:CT head - negative  Impression: 74 yo M with febrile illness and delirium. I suspect that this simply represents delirium in teh setting of physiological stressor, but agree with plan for LP to rule out CNS process. IF this is negative, then would treat supportively.   Recommendations: 1) Agree with LP 2) will continue to follow.  3) will send flu panel  Roland Rack, MD [facility] [phone]  If 7pm- 7am, please page neurology on call as listed in [place].   CSF is clear, suspect improving delirium. Please call if any further questions or concerns remain.   Roland Rack, MD [facility] [phone]  If 7pm- 7am, please page neurology on call as listed in [place].

## 2014-09-22 NOTE — ED Notes (Signed)
CBG 97 

## 2014-09-22 NOTE — H&P (Signed)
[facility] History and Physical  Tanner Patton TDD:[id number] DOB: December 26, 1940 DOA: 09/22/2014  Referring physician: Emergency Department PCP: No PCP Per Patient  Specialists:   Chief Complaint: SIRS  HPI: Tanner Patton is a 74 y.o. male  With hx of bph who presents to the ED after being found with altered mental status in the parking lot. Pt was also noted to have slurred speech. Pt did not recall specific details but did remember feeling "swimmy headed and confused." In the ED, pt was noted to be febrile with Tmax of 102 rectally with leukocytosis of over 23k. The patient was found to have unremarkable UA and CXR. Case was discussed with Neurology by night team with recs for LP. LP was attempted at bedside by ED staff and was unsuccessful. IR guided LP is pending. Hospitalist service was consulted for admission.  On further questioning, pt states he lives in [place] and lives with a paraplegic son who had been treated for pneumonia over the past week. Pt denies coughing prior to admit but notes increased productive cough on the day of admission.  Review of Systems:  Per above, the remainder of the 10pt ros reviewed and are neg  No past medical history on file.  BPH No past surgical history on file. Social History:  has no tobacco, alcohol, and drug history on file. From [place], lives with paraplegic son where does patient live--home, ALF, SNF? and with whom if at home?  Can patient participate in ADLs?  No Known Allergies  No family history on file. Reviewed and is noncontributory to this particular case (be sure to complete)  Prior to Admission medications   Medication Sig Start Date End Date Taking? Authorizing Provider  alfuzosin (UROXATRAL) 10 MG 24 hr tablet Take 10 mg by mouth at bedtime.   Yes Historical Provider, MD  Armodafinil (NUVIGIL) 150 MG tablet Take 150 mg by mouth at bedtime.   Yes Historical Provider, MD  aspirin 325 MG tablet Take 325 mg by mouth  once.   Yes Historical Provider, MD  aspirin 81 MG chewable tablet Chew 81 mg by mouth at bedtime.   Yes Historical Provider, MD  atorvastatin (LIPITOR) 20 MG tablet Take 20 mg by mouth at bedtime.   Yes Historical Provider, MD  azelastine (ASTELIN) 0.1 % nasal spray Place 2 sprays into both nostrils 2 (two) times daily. Use in each nostril as directed   Yes Historical Provider, MD  dutasteride (AVODART) 0.5 MG capsule Take 0.5 mg by mouth at bedtime.   Yes Historical Provider, MD  esomeprazole (NEXIUM) 40 MG capsule Take 40 mg by mouth daily at 12 noon.   Yes Historical Provider, MD  lactobacillus acidophilus (BACID) TABS tablet Take 2 tablets by mouth 2 (two) times daily.   Yes Historical Provider, MD  LECITHIN PO Take 1 tablet by mouth daily.   Yes Historical Provider, MD  montelukast (SINGULAIR) 10 MG tablet Take 10 mg by mouth at bedtime.   Yes Historical Provider, MD  Multiple Vitamin (MULTIVITAMIN WITH MINERALS) TABS tablet Take 1 tablet by mouth daily.   Yes Historical Provider, MD  nortriptyline (PAMELOR) 50 MG capsule Take 50 mg by mouth at bedtime.   Yes Historical Provider, MD  omega-3 acid ethyl esters (LOVAZA) 1 G capsule Take 1 g by mouth daily.   Yes Historical Provider, MD  OVER THE COUNTER MEDICATION Take 1 tablet by mouth daily. Force factor   Yes Historical Provider, MD  polycarbophil (FIBERCON) 625 MG tablet  Take 625 mg by mouth daily.   Yes Historical Provider, MD  Pseudoephedrine-APAP-DM (DAYQUIL PO) Take 2 capsules by mouth every 4 (four) hours as needed (cold symptoms).   Yes Historical Provider, MD  rOPINIRole (REQUIP) 1 MG tablet Take 1 mg by mouth at bedtime.   Yes Historical Provider, MD  sildenafil (VIAGRA) 100 MG tablet Take 100 mg by mouth daily as needed for erectile dysfunction.   Yes Historical Provider, MD  testosterone cypionate (DEPOTESTOTERONE CYPIONATE) 100 MG/ML injection Inject 200 mg into the muscle every 7 (seven) days. On Sat. For IM use only   Yes  Historical Provider, MD  vitamin B-12 (CYANOCOBALAMIN) 1000 MCG tablet Take 1,000 mcg by mouth daily.   Yes Historical Provider, MD   Physical Exam: Filed Vitals:   09/22/14 6578 09/22/14 4696 09/22/14 0736 09/22/14 0812  BP: 108/57 90/40 91/50  96/53  Pulse: 90  84 81  Temp: 102 F (38.9 C)  98.5 F (36.9 C) 98.2 F (36.8 C)  TempSrc: Rectal  Oral Oral  Resp: 21 25 22 18   Height: 6\' 2"  (1.88 m)     Weight: 102.967 kg (227 lb)     SpO2: 94% 93% 95% 95%     General:  Lethargic, easily arousable, in NAD  Eyes: PERRL B  ENT: membranes moist, dentition fair  Neck: trachea midline, neck supple  Cardiovascular: regular, s1, s2  Respiratory: normal resp effort, no wheezing  Abdomen: soft,nondistended  Skin: normal skin turgor, no abnormal skin lesions seen  Musculoskeletal: perfused, no clubbing  Psychiatric: mood/affect normal // no auditory/visual hallucinations  Neurologic: cn2-12 grossly intact, strength/sensation intact  Labs on Admission:  Basic Metabolic Panel:  Recent Labs Lab 09/22/14 0303  NA 136  K 4.1  CL 103  CO2 24  GLUCOSE 107*  BUN 18  CREATININE 0.92  CALCIUM 9.0   Liver Function Tests:  Recent Labs Lab 09/22/14 0303  AST 23  ALT 18  ALKPHOS 68  BILITOT 1.7*  PROT 6.3  ALBUMIN 3.8   No results for input(s): LIPASE, AMYLASE in the last 168 hours. No results for input(s): AMMONIA in the last 168 hours. CBC:  Recent Labs Lab 09/22/14 0303  WBC 23.0*  NEUTROABS 20.0*  HGB 15.7  HCT 46.4  MCV 97.5  PLT 185   Cardiac Enzymes: No results for input(s): CKTOTAL, CKMB, CKMBINDEX, TROPONINI in the last 168 hours.  BNP (last 3 results) No results for input(s): PROBNP in the last 8760 hours. CBG:  Recent Labs Lab 09/22/14 0250  GLUCAP 97    Radiological Exams on Admission: Ct Head Wo Contrast  09/22/2014   CLINICAL DATA:  Acute onset of altered mental status. Slurred speech. Code stroke. Initial encounter.  EXAM: CT HEAD  WITHOUT CONTRAST  TECHNIQUE: Contiguous axial images were obtained from the base of the skull through the vertex without intravenous contrast.  COMPARISON:  None.  FINDINGS: There is no evidence of acute infarction, mass lesion, or intra- or extra-axial hemorrhage on CT.  Prominence of the ventricles and sulci reflects mild to moderate cortical volume loss. Scattered periventricular and subcortical white matter change likely reflects small vessel ischemic microangiopathy. Cerebellar atrophy is noted.  The brainstem and fourth ventricle are within normal limits. The basal ganglia are unremarkable in appearance. The cerebral hemispheres demonstrate grossly normal gray-white differentiation. No mass effect or midline shift is seen.  There is no evidence of fracture; visualized osseous structures are unremarkable in appearance. The orbits are within normal limits. The paranasal sinuses  and mastoid air cells are well-aerated. No significant soft tissue abnormalities are seen.  IMPRESSION: 1. No acute intracranial pathology seen on CT. 2. Mild to moderate cortical volume loss and scattered small vessel ischemic microangiopathy.  These results were called by telephone at the time of interpretation on 09/22/2014 at 3:35 am to Dr. Julianne Rice, who verbally acknowledged these results.   Electronically Signed   By: Garald Balding M.D.   On: 09/22/2014 03:35   Mr Brain Wo Contrast  09/22/2014   CLINICAL DATA:  Acute encephalopathy. Sudden onset of confusion and slurred speech.  EXAM: MRI HEAD WITHOUT CONTRAST  TECHNIQUE: Multiplanar, multiecho pulse sequences of the brain and surrounding structures were obtained without intravenous contrast.  COMPARISON:  Head CT same day  FINDINGS: Diffusion imaging does not show any acute or subacute infarction. There chronic small-vessel ischemic changes throughout the pons. No focal cerebellar insult. 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 There are moderate chronic small-vessel ischemic changes affecting the deep and subcortical white matter. No cortical or large vessel territory infarction. No mass lesion, hemorrhage, hydrocephalus or extra-axial collection. No pituitary mass. No significant sinus disease. No skull or skullbase lesion. Major vessels at the base of the brain show flow.  IMPRESSION: No acute finding. 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   Electronically Signed   By: Nelson Chimes M.D.   On: 09/22/2014 07:29   Dg Chest Port 1 View  09/22/2014   CLINICAL DATA:  Fever.  Altered mental status.  EXAM: PORTABLE CHEST - 1 VIEW  COMPARISON:  None.  FINDINGS: The heart size and mediastinal contours are within normal limits. Both lungs are clear. The visualized skeletal structures are unremarkable.  IMPRESSION: No active disease.   Electronically Signed   By: Lucienne Capers M.D.   On: 09/22/2014 04:09    Assessment/Plan Principal Problem:   SIRS (systemic inflammatory response syndrome) Active Problems:   Fever   Leukocytosis   Lethargy  1. SIRS 1. Unclear etiology 2. UA and cxr unremarkable 3. Blood and urine cx pending 4. IR guided LP pending, as recommended by Neurology 5. Empiric vanc and zosyn ordered - to be resumed once LP is done 6. Follow pan cultures 7. Admitted to med-tele 2. Fever  1. Acetaminophen as needed 2. Per above 3. Leukocytosis 1. Follow CBC 2. Cont empiric abx once LP is completed per above 4. AMS/Lethargy 1. Pt interactive and oriented this AM, possibly improving 2. Likely related to above 3. Cont to monitor 5. DVT prophylaxis 1. Heparin subq  Code Status: Full (must indicate code status--if unknown or must be presumed, indicate so) Family Communication: Pt in room (indicate person spoken with, if applicable, with phone number if by telephone) Disposition Plan: Pending (indicate anticipated LOS)  Time spent:  36min  Emmaly Leech, [facility] Pager [phone]  If 7PM-7AM, please contact night-coverage [email or web address] Password [facility] 09/22/2014, 8:23 AM

## 2014-09-22 NOTE — Progress Notes (Signed)
Walked in to pt room and saw a moderate amount of green/yellow emesis noted on linens and gown and floor.   Pt vomited twice.  Zofran given, MD notified.

## 2014-09-23 ENCOUNTER — Encounter (HOSPITAL_COMMUNITY): Payer: Self-pay | Admitting: *Deleted

## 2014-09-23 DIAGNOSIS — R509 Fever, unspecified: Secondary | ICD-10-CM | POA: Diagnosis not present

## 2014-09-23 LAB — CBC
HCT: 40.7 % (ref 39.0–52.0)
HEMOGLOBIN: 13.4 g/dL (ref 13.0–17.0)
MCH: 32.7 pg (ref 26.0–34.0)
MCHC: 32.9 g/dL (ref 30.0–36.0)
MCV: 99.3 fL (ref 78.0–100.0)
PLATELETS: 148 10*3/uL — AB (ref 150–400)
RBC: 4.1 MIL/uL — ABNORMAL LOW (ref 4.22–5.81)
RDW: 14.6 % (ref 11.5–15.5)
WBC: 16.3 10*3/uL — AB (ref 4.0–10.5)

## 2014-09-23 LAB — URINE CULTURE
CULTURE: NO GROWTH
Colony Count: NO GROWTH

## 2014-09-23 LAB — COMPREHENSIVE METABOLIC PANEL
ALK PHOS: 54 U/L (ref 39–117)
ALT: 14 U/L (ref 0–53)
AST: 18 U/L (ref 0–37)
Albumin: 3.1 g/dL — ABNORMAL LOW (ref 3.5–5.2)
Anion gap: 7 (ref 5–15)
BUN: 29 mg/dL — AB (ref 6–23)
CO2: 25 mmol/L (ref 19–32)
Calcium: 8.3 mg/dL — ABNORMAL LOW (ref 8.4–10.5)
Chloride: 103 mEq/L (ref 96–112)
Creatinine, Ser: 1.12 mg/dL (ref 0.50–1.35)
GFR, EST AFRICAN AMERICAN: 72 mL/min — AB (ref >90)
GFR, EST NON AFRICAN AMERICAN: 62 mL/min — AB (ref >90)
GLUCOSE: 150 mg/dL — AB (ref 70–99)
Potassium: 3.8 mmol/L (ref 3.5–5.1)
SODIUM: 135 mmol/L (ref 135–145)
Total Bilirubin: 1.7 mg/dL — ABNORMAL HIGH (ref 0.3–1.2)
Total Protein: 5.3 g/dL — ABNORMAL LOW (ref 6.0–8.3)

## 2014-09-23 LAB — INFLUENZA PANEL BY PCR (TYPE A & B)
H1N1 flu by pcr: NOT DETECTED
Influenza A By PCR: NEGATIVE
Influenza B By PCR: NEGATIVE

## 2014-09-23 LAB — PROCALCITONIN: PROCALCITONIN: 1.96 ng/mL

## 2014-09-23 MED ORDER — VANCOMYCIN HCL 10 G IV SOLR
1250.0000 mg | INTRAVENOUS | Status: DC
  Administered 2014-09-23: 1250 mg via INTRAVENOUS
  Filled 2014-09-23 (×2): qty 1250

## 2014-09-23 MED ORDER — LACTINEX PO CHEW
2.0000 | CHEWABLE_TABLET | Freq: Two times a day (BID) | ORAL | Status: DC
  Administered 2014-09-23 – 2014-09-24 (×3): 2 via ORAL
  Filled 2014-09-23 (×6): qty 2

## 2014-09-23 NOTE — Progress Notes (Signed)
PT Cancellation Note / Screen  Patient Details Name: Tanner Patton MRN: [id number] DOB: [birth year]   Cancelled Treatment:    Reason Eval/Treat Not Completed: PT screened, no needs identified, will sign off Observed pt ambulating around unit with RN.  Pt does not appear to have any acute needs at this time.  Please re-order if pt mobility status changes. Thanks   Jaonna Word,KATHrine E 09/23/2014, 2:55 PM Carmelia Bake, PT, DPT 09/23/2014 Pager: [phone]

## 2014-09-23 NOTE — Clinical Documentation Improvement (Signed)
Possible Clinical Conditions? Septicemia / Sepsis Severe Sepsis Neutropenic Sepsis  Septic Shock Other Condition  Cannot clinically Determine   Supporting Information:(As per notes) Admitted with Fever, confusion, lethargy Admission Vitals:  BP 90/40 Temp:102F Resp:25 SpO2:93 WBC:23.0  Thank You, Alessandra Grout, RN, BSN, CCDS,Clinical Documentation Specialist:  [phone]  [phone]=[facility]- Health Information Management

## 2014-09-23 NOTE — Progress Notes (Signed)
[facility] PROGRESS NOTE  Tanner Patton ZOX:[id number] DOB: October 30, 1940 DOA: 09/22/2014 PCP: No PCP Per Patient  Assessment/Plan: 1. Fever, leukocytosis and AMS: Unlikely meningitis. CSF is clear and wbc is 2 and no organisms. Cultures negative so far. WBC count is improving. Would continue the same antibiotics for now.  UA, CXR negative. No nause, vomiting or abdominal pain.    Code Status: full code.  Family Communication: none at bedside Disposition Plan: pending   Consultants:  neurology  Procedures:  LP  MRI Brain  Antibiotics:  Vancomycin   zosyn  HPI/Subjective: Comfortable. No new complaints.   Objective: Filed Vitals:   09/23/14 0539  BP: 100/53  Pulse: 59  Temp: 97.7 F (36.5 C)  Resp: 20    Intake/Output Summary (Last 24 hours) at 09/23/14 1433 Last data filed at 09/23/14 1416  Gross per 24 hour  Intake 2157.5 ml  Output   1050 ml  Net 1107.5 ml   Filed Weights   09/22/14 0620  Weight: 102.967 kg (227 lb)    Exam:   General:  Alert afebrile comfortable  Cardiovascular: s1s2  Respiratory: ctab.  Abdomen: soft non tender non distended bowel sounds heard  Musculoskeletal: no pedal edema.   Data Reviewed: Basic Metabolic Panel:  Recent Labs Lab 09/22/14 0303 09/23/14 0405  NA 136 135  K 4.1 3.8  CL 103 103  CO2 24 25  GLUCOSE 107* 150*  BUN 18 29*  CREATININE 0.92 1.12  CALCIUM 9.0 8.3*   Liver Function Tests:  Recent Labs Lab 09/22/14 0303 09/23/14 0405  AST 23 18  ALT 18 14  ALKPHOS 68 54  BILITOT 1.7* 1.7*  PROT 6.3 5.3*  ALBUMIN 3.8 3.1*   No results for input(s): LIPASE, AMYLASE in the last 168 hours. No results for input(s): AMMONIA in the last 168 hours. CBC:  Recent Labs Lab 09/22/14 0303 09/23/14 0405  WBC 23.0* 16.3*  NEUTROABS 20.0*  --   HGB 15.7 13.4  HCT 46.4 40.7  MCV 97.5 99.3  PLT 185 148*   Cardiac Enzymes: No results for input(s): CKTOTAL, CKMB, CKMBINDEX, TROPONINI  in the last 168 hours. BNP (last 3 results) No results for input(s): PROBNP in the last 8760 hours. CBG:  Recent Labs Lab 09/22/14 0250  GLUCAP 97    Recent Results (from the past 240 hour(s))  Culture, Urine     Status: None   Collection Time: 09/22/14  3:26 AM  Result Value Ref Range Status   Specimen Description URINE, CATHETERIZED  Final   Special Requests NONE  Final   Colony Count NO GROWTH Performed at [facility]   Final   Culture NO GROWTH Performed at [facility]   Final   Report Status 09/23/2014 FINAL  Final  Culture, blood (routine x 2)     Status: None (Preliminary result)   Collection Time: 09/22/14  4:29 AM  Result Value Ref Range Status   Specimen Description BLOOD RIGHT ANTECUBITAL  Final   Special Requests BOTTLES DRAWN AEROBIC AND ANAEROBIC 5ML  Final   Culture   Final           BLOOD CULTURE RECEIVED NO GROWTH TO DATE CULTURE WILL BE HELD FOR 5 DAYS BEFORE ISSUING A FINAL NEGATIVE REPORT Performed at [facility]    Report Status PENDING  Incomplete  Culture, blood (routine x 2)     Status: None (Preliminary result)   Collection Time: 09/22/14  4:29 AM  Result Value Ref  Range Status   Specimen Description BLOOD LEFT ANTECUBITAL  Final   Special Requests BOTTLES DRAWN AEROBIC AND ANAEROBIC 5CC  Final   Culture   Final           BLOOD CULTURE RECEIVED NO GROWTH TO DATE CULTURE WILL BE HELD FOR 5 DAYS BEFORE ISSUING A FINAL NEGATIVE REPORT Performed at [facility]    Report Status PENDING  Incomplete  CSF culture     Status: None (Preliminary result)   Collection Time: 09/22/14 12:32 PM  Result Value Ref Range Status   Specimen Description CSF  Final   Special Requests NONE  Final   Gram Stain   Final    NO WBC SEEN NO ORGANISMS SEEN Performed at [facility]    Culture   Final    NO GROWTH 1 DAY Performed at [facility]    Report Status PENDING  Incomplete     Studies: Ct Head Wo  Contrast  09/22/2014   CLINICAL DATA:  Acute onset of altered mental status. Slurred speech. Code stroke. Initial encounter.  EXAM: CT HEAD WITHOUT CONTRAST  TECHNIQUE: Contiguous axial images were obtained from the base of the skull through the vertex without intravenous contrast.  COMPARISON:  None.  FINDINGS: There is no evidence of acute infarction, mass lesion, or intra- or extra-axial hemorrhage on CT.  Prominence of the ventricles and sulci reflects mild to moderate cortical volume loss. Scattered periventricular and subcortical white matter change likely reflects small vessel ischemic microangiopathy. Cerebellar atrophy is noted.  The brainstem and fourth ventricle are within normal limits. The basal ganglia are unremarkable in appearance. The cerebral hemispheres demonstrate grossly normal gray-white differentiation. No mass effect or midline shift is seen.  There is no evidence of fracture; visualized osseous structures are unremarkable in appearance. The orbits are within normal limits. The paranasal sinuses and mastoid air cells are well-aerated. No significant soft tissue abnormalities are seen.  IMPRESSION: 1. No acute intracranial pathology seen on CT. 2. Mild to moderate cortical volume loss and scattered small vessel ischemic microangiopathy.  These results were called by telephone at the time of interpretation on 09/22/2014 at 3:35 am to Dr. Julianne Rice, who verbally acknowledged these results.   Electronically Signed   By: Garald Balding M.D.   On: 09/22/2014 03:35   Mr Brain Wo Contrast  09/22/2014   CLINICAL DATA:  Acute encephalopathy. Sudden onset of confusion and slurred speech.  EXAM: MRI HEAD WITHOUT CONTRAST  TECHNIQUE: Multiplanar, multiecho pulse sequences of the brain and surrounding structures were obtained without intravenous contrast.  COMPARISON:  Head CT same day  FINDINGS: Diffusion imaging does not show any acute or subacute infarction. There chronic small-vessel ischemic  changes throughout the pons. No focal cerebellar insult. 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 There are moderate chronic small-vessel ischemic changes affecting the deep and subcortical white matter. No cortical or large vessel territory infarction. No mass lesion, hemorrhage, hydrocephalus or extra-axial collection. No pituitary mass. No significant sinus disease. No skull or skullbase lesion. Major vessels at the base of the brain show flow.  IMPRESSION: No acute finding. 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   Electronically Signed   By: Nelson Chimes M.D.   On: 09/22/2014 07:29   US Abdomen Port  09/22/2014   CLINICAL DATA:  Hyperbilirubinemia.  EXAM: US ABDOMEN LIMITED - RIGHT UPPER QUADRANT  COMPARISON:  None.  FINDINGS: Gallbladder:  No gallstones or wall thickening visualized. No sonographic Murphy sign noted.  Common bile duct:  Diameter: 5 mm  Liver:  Small 1.2 cm hypoechoic region is noted within the left lobe likely representing a small cyst. Mild increased echogenicity is noted which may be related to fatty infiltration.  IMPRESSION: Likely small hepatic cysts.  Fatty infiltration.  No other focal abnormality is seen.   Electronically Signed   By: Inez Catalina M.D.   On: 09/22/2014 15:19   Dg Chest Port 1 View  09/22/2014   CLINICAL DATA:  Fever.  Altered mental status.  EXAM: PORTABLE CHEST - 1 VIEW  COMPARISON:  None.  FINDINGS: The heart size and mediastinal contours are within normal limits. Both lungs are clear. The visualized skeletal structures are unremarkable.  IMPRESSION: No active disease.   Electronically Signed   By: Lucienne Capers M.D.   On: 09/22/2014 04:09   Dg Lumbar Puncture Fluoro Guide  09/22/2014   CLINICAL DATA:  Fever  EXAM: DIAGNOSTIC LUMBAR PUNCTURE UNDER FLUOROSCOPIC GUIDANCE  FLUOROSCOPY TIME:  12 seconds  PROCEDURE: Informed consent was obtained from  the patient prior to the procedure, including potential complications of headache, allergy, and pain. With the patient prone, the lower back was prepped with Betadine. 1% Lidocaine was used for local anesthesia. Lumbar puncture was performed at the L3-4 level using a 22 gauge needle with return of clear CSF with an opening pressure of 23 cm water. 10.82ml of CSF were obtained for laboratory studies. The patient tolerated the procedure well and there were no apparent complications.  IMPRESSION: Successful fluoroscopic guided lumbar puncture, as above.   Electronically Signed   By: Julian Hy M.D.   On: 09/22/2014 13:14    Scheduled Meds: . alfuzosin  10 mg Oral QHS  . aspirin  81 mg Oral QHS  . atorvastatin  20 mg Oral QHS  . heparin  5,000 Units Subcutaneous 3 times per day  . lactobacillus acidophilus & bulgar  2 tablet Oral BID  . pantoprazole  40 mg Oral Daily  . piperacillin-tazobactam (ZOSYN)  IV  3.375 g Intravenous Q8H  . sodium chloride  3 mL Intravenous Q12H  . vancomycin  1,250 mg Intravenous Q24H   Continuous Infusions: . sodium chloride 75 mL/hr at 09/22/14 1806    Principal Problem:   SIRS (systemic inflammatory response syndrome) Active Problems:   Fever   Leukocytosis   Lethargy    Time spent: 15 min    Lauralei Clouse  [facility] Pager [phone] If 7PM-7AM, please contact night-coverage at [email or web address], password [facility] 09/23/2014, 2:33 PM  LOS: 1 day

## 2014-09-23 NOTE — Progress Notes (Signed)
ANTIBIOTIC CONSULT NOTE - FOLLOW UP  Pharmacy Consult for Vancomycin, Zosyn Indication: rule out sepsis, r/o CNS infection  No Known Allergies  Patient Measurements: Height: 6\' 2"  (188 cm) Weight: 227 lb (102.967 kg) IBW/kg (Calculated) : 82.2  Vital Signs: Temp: 97.7 F (36.5 C) (01/07 0539) Temp Source: Oral (01/07 0539) BP: 100/53 mmHg (01/07 0539) Pulse Rate: 59 (01/07 0539) Intake/Output from previous day: 01/06 0701 - 01/07 0700 In: 2157.5 [P.O.:120; I.V.:1587.5; IV Piggyback:450] Out: 400 [Urine:400] Intake/Output from this shift: Total I/O In: -  Out: 275 [Urine:275]  Labs:  Recent Labs  09/22/14 0303 09/23/14 0405  WBC 23.0* 16.3*  HGB 15.7 13.4  PLT 185 148*  CREATININE 0.92 1.12   Estimated Creatinine Clearance: 72.9 mL/min (by C-G formula based on Cr of 1.12). No results for input(s): VANCOTROUGH, VANCOPEAK, VANCORANDOM, GENTTROUGH, GENTPEAK, GENTRANDOM, TOBRATROUGH, TOBRAPEAK, TOBRARND, AMIKACINPEAK, AMIKACINTROU, AMIKACIN in the last 72 hours.     Assessment: 74 y/o M found confused walking in [place] parking lot with slurred speech and T 102.3.  WBC elevated, but CXR and UA not suggestive of infection, head CT WNL. Neuro recommends LP and MRI. EDP placed on empiric Zosyn and vanco with pharmacy dosing assistance requested, then called pharmacy and asked that first doses be delayed until after LP.  1/6 >> Zosyn >> 1/6 >> vancomycin >>   Micro: 1/6 blood x2: ngtd 1/6 urine: IP 1/6 CSF: ng x1d    (CSF: clear, colorless, Glucose 69, Protein 26, RBC 4, WBC 2) 1/6 Influenza: negative  Today, 09/23/2014:   Tmax: 103.8  WBCs: elevated but improved to 16.3  Renal:  SCr increased to 1.12, CrCl ~ 73 ml/min CG (~ 58 N)   Goal of Therapy:  Vancomycin trough level 15-20 mcg/ml  Appropriate abx dosing, eradication of infection.   Plan:   Continue Zosyn 3.375g IV Q8H infused over 4hrs.  Decrease to Vancomycin 1250mg  IV q24h.  Measure  Vanc trough at steady state.  Follow up renal fxn and culture results.  ** MD, If concerned for meningitis, please consider changing from Zosyn to Ceftriaxone and Ampicillin for better CNS coverage and CNS penetration. Gretta Arab PharmD, BCPS Pager [phone] 09/23/2014 10:28 AM

## 2014-09-24 LAB — CBC
HEMATOCRIT: 41.3 % (ref 39.0–52.0)
HEMOGLOBIN: 13.7 g/dL (ref 13.0–17.0)
MCH: 32.5 pg (ref 26.0–34.0)
MCHC: 33.2 g/dL (ref 30.0–36.0)
MCV: 97.9 fL (ref 78.0–100.0)
Platelets: 148 10*3/uL — ABNORMAL LOW (ref 150–400)
RBC: 4.22 MIL/uL (ref 4.22–5.81)
RDW: 14.3 % (ref 11.5–15.5)
WBC: 8.2 10*3/uL (ref 4.0–10.5)

## 2014-09-24 MED ORDER — LEVOFLOXACIN 500 MG PO TABS: 500.0000 mg | ORAL_TABLET | Freq: Every day | ORAL | Status: AC

## 2014-09-24 NOTE — Progress Notes (Signed)
Went over discharge information with pt.  All questions answered.  Prescriptions given.  VSS.  Pt refused wheelchair, walked out with daughter.

## 2014-09-24 NOTE — Discharge Summary (Signed)
Physician Discharge Summary  Tanner Patton BOF:[id number] DOB: 1940/11/21 DOA: 09/22/2014  PCP: No PCP Per Patient  Admit date: 09/22/2014 Discharge date: 09/24/2014  Time spent: 30 minutes  Recommendations for Outpatient Follow-up:  1. Follow up with PCP in one week.   Discharge Diagnoses:  Principal Problem:   SIRS (systemic inflammatory response syndrome) Active Problems:   Fever   Leukocytosis   Lethargy   Discharge Condition: improved.   Diet recommendation: regular Filed Weights   09/22/14 0620  Weight: 102.967 kg (227 lb)    History of present illness: Hospital Course:  Tanner Patton is a 74 y.o. male with h/o BPH was found encephalopathic in the parking lot. On arrivalt o ED , he was febrile to 103 and  was coughing. And waas admitted to medical service for evaluation of meningitis. HE underwent LP and his CSF was clear and his wbc count was 3. He was empirically started on antibiotics for cover meningitis and pneumonia. His CXR was clear for pneumonia and his UA was clean. His leukocytosis resolved in 48 hours and his cultures have been negative so far. He is being discharged today with levaquin to complete the course for possible early pneumonia.   Procedures:  LUMBAR PUNCTURE  Consultations: neurology Discharge Exam: Filed Vitals:   09/24/14 0448  BP: 116/67  Pulse: 78  Temp: 97.7 F (36.5 C)  Resp: 20    General: ALERT AFEBRILE COMFORTABLE Cardiovascular: s1s2 Respiratory: ctab  Discharge Instructions   Discharge Instructions    Discharge instructions    Complete by:  As directed   FOLLOW UP WITH PCP in 1 to 2 weeks.          Current Discharge Medication List    START taking these medications   Details  levofloxacin (LEVAQUIN) 500 MG tablet Take 1 tablet (500 mg total) by mouth daily. Qty: 3 tablet, Refills: 0      CONTINUE these medications which have NOT CHANGED   Details  alfuzosin (UROXATRAL) 10 MG 24 hr tablet Take 10  mg by mouth at bedtime.    Armodafinil (NUVIGIL) 150 MG tablet Take 150 mg by mouth at bedtime.    aspirin 81 MG chewable tablet Chew 81 mg by mouth at bedtime.    atorvastatin (LIPITOR) 20 MG tablet Take 20 mg by mouth at bedtime.    azelastine (ASTELIN) 0.1 % nasal spray Place 2 sprays into both nostrils 2 (two) times daily. Use in each nostril as directed    dutasteride (AVODART) 0.5 MG capsule Take 0.5 mg by mouth at bedtime.    esomeprazole (NEXIUM) 40 MG capsule Take 40 mg by mouth daily at 12 noon.    lactobacillus acidophilus (BACID) TABS tablet Take 2 tablets by mouth 2 (two) times daily.    LECITHIN PO Take 1 tablet by mouth daily.    montelukast (SINGULAIR) 10 MG tablet Take 10 mg by mouth at bedtime.    Multiple Vitamin (MULTIVITAMIN WITH MINERALS) TABS tablet Take 1 tablet by mouth daily.    nortriptyline (PAMELOR) 50 MG capsule Take 50 mg by mouth at bedtime.    omega-3 acid ethyl esters (LOVAZA) 1 G capsule Take 1 g by mouth daily.    OVER THE COUNTER MEDICATION Take 1 tablet by mouth daily. Force factor    polycarbophil (FIBERCON) 625 MG tablet Take 625 mg by mouth daily.    Pseudoephedrine-APAP-DM (DAYQUIL PO) Take 2 capsules by mouth every 4 (four) hours as needed (cold symptoms).  rOPINIRole (REQUIP) 1 MG tablet Take 1 mg by mouth at bedtime.    testosterone cypionate (DEPOTESTOTERONE CYPIONATE) 100 MG/ML injection Inject 200 mg into the muscle every 7 (seven) days. On Sat. For IM use only    vitamin B-12 (CYANOCOBALAMIN) 1000 MCG tablet Take 1,000 mcg by mouth daily.      STOP taking these medications     aspirin 325 MG tablet      sildenafil (VIAGRA) 100 MG tablet        No Known Allergies Follow-up Information    Follow up with No PCP Per Patient.   Specialty:  General Practice       The results of significant diagnostics from this hospitalization (including imaging, microbiology, ancillary and laboratory) are listed below for reference.     Significant Diagnostic Studies: Ct Head Wo Contrast  09/22/2014   CLINICAL DATA:  Acute onset of altered mental status. Slurred speech. Code stroke. Initial encounter.  EXAM: CT HEAD WITHOUT CONTRAST  TECHNIQUE: Contiguous axial images were obtained from the base of the skull through the vertex without intravenous contrast.  COMPARISON:  None.  FINDINGS: There is no evidence of acute infarction, mass lesion, or intra- or extra-axial hemorrhage on CT.  Prominence of the ventricles and sulci reflects mild to moderate cortical volume loss. Scattered periventricular and subcortical white matter change likely reflects small vessel ischemic microangiopathy. Cerebellar atrophy is noted.  The brainstem and fourth ventricle are within normal limits. The basal ganglia are unremarkable in appearance. The cerebral hemispheres demonstrate grossly normal gray-white differentiation. No mass effect or midline shift is seen.  There is no evidence of fracture; visualized osseous structures are unremarkable in appearance. The orbits are within normal limits. The paranasal sinuses and mastoid air cells are well-aerated. No significant soft tissue abnormalities are seen.  IMPRESSION: 1. No acute intracranial pathology seen on CT. 2. Mild to moderate cortical volume loss and scattered small vessel ischemic microangiopathy.  These results were called by telephone at the time of interpretation on 09/22/2014 at 3:35 am to Dr. Julianne Rice, who verbally acknowledged these results.   Electronically Signed   By: Garald Balding M.D.   On: 09/22/2014 03:35   Mr Brain Wo Contrast  09/22/2014   CLINICAL DATA:  Acute encephalopathy. Sudden onset of confusion and slurred speech.  EXAM: MRI HEAD WITHOUT CONTRAST  TECHNIQUE: Multiplanar, multiecho pulse sequences of the brain and surrounding structures were obtained without intravenous contrast.  COMPARISON:  Head CT same day  FINDINGS: Diffusion imaging does not show any acute or subacute  infarction. There chronic small-vessel ischemic changes throughout the pons. No focal cerebellar insult. 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 There are moderate chronic small-vessel ischemic changes affecting the deep and subcortical white matter. No cortical or large vessel territory infarction. No mass lesion, hemorrhage, hydrocephalus or extra-axial collection. No pituitary mass. No significant sinus disease. No skull or skullbase lesion. Major vessels at the base of the brain show flow.  IMPRESSION: No acute finding. 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   Electronically Signed   By: Nelson Chimes M.D.   On: 09/22/2014 07:29   US Abdomen Port  09/22/2014   CLINICAL DATA:  Hyperbilirubinemia.  EXAM: US ABDOMEN LIMITED - RIGHT UPPER QUADRANT  COMPARISON:  None.  FINDINGS: Gallbladder:  No gallstones or wall thickening visualized. No sonographic Murphy sign noted.  Common bile duct:  Diameter: 5 mm  Liver:  Small 1.2 cm hypoechoic region is noted within the left lobe likely representing a small cyst. Mild increased echogenicity is noted which may be related to fatty infiltration.  IMPRESSION: Likely small hepatic cysts.  Fatty infiltration.  No other focal abnormality is seen.   Electronically Signed   By: Inez Catalina M.D.   On: 09/22/2014 15:19   Dg Chest Port 1 View  09/22/2014   CLINICAL DATA:  Fever.  Altered mental status.  EXAM: PORTABLE CHEST - 1 VIEW  COMPARISON:  None.  FINDINGS: The heart size and mediastinal contours are within normal limits. Both lungs are clear. The visualized skeletal structures are unremarkable.  IMPRESSION: No active disease.   Electronically Signed   By: Lucienne Capers M.D.   On: 09/22/2014 04:09   Dg Lumbar Puncture Fluoro Guide  09/22/2014   CLINICAL DATA:  Fever  EXAM: DIAGNOSTIC LUMBAR PUNCTURE UNDER FLUOROSCOPIC GUIDANCE  FLUOROSCOPY TIME:  12  seconds  PROCEDURE: Informed consent was obtained from the patient prior to the procedure, including potential complications of headache, allergy, and pain. With the patient prone, the lower back was prepped with Betadine. 1% Lidocaine was used for local anesthesia. Lumbar puncture was performed at the L3-4 level using a 22 gauge needle with return of clear CSF with an opening pressure of 23 cm water. 10.54ml of CSF were obtained for laboratory studies. The patient tolerated the procedure well and there were no apparent complications.  IMPRESSION: Successful fluoroscopic guided lumbar puncture, as above.   Electronically Signed   By: Julian Hy M.D.   On: 09/22/2014 13:14    Microbiology: Recent Results (from the past 240 hour(s))  Culture, Urine     Status: None   Collection Time: 09/22/14  3:26 AM  Result Value Ref Range Status   Specimen Description URINE, CATHETERIZED  Final   Special Requests NONE  Final   Colony Count NO GROWTH Performed at [facility]   Final   Culture NO GROWTH Performed at [facility]   Final   Report Status 09/23/2014 FINAL  Final  Culture, blood (routine x 2)     Status: None (Preliminary result)   Collection Time: 09/22/14  4:29 AM  Result Value Ref Range Status   Specimen Description BLOOD RIGHT ANTECUBITAL  Final   Special Requests BOTTLES DRAWN AEROBIC AND ANAEROBIC 5ML  Final   Culture   Final           BLOOD CULTURE RECEIVED NO GROWTH TO DATE CULTURE WILL BE HELD FOR 5 DAYS BEFORE ISSUING A FINAL NEGATIVE REPORT Performed at [facility]    Report Status PENDING  Incomplete  Culture, blood (routine x 2)     Status: None (Preliminary result)   Collection Time: 09/22/14  4:29 AM  Result Value Ref Range Status   Specimen Description BLOOD LEFT ANTECUBITAL  Final   Special Requests BOTTLES DRAWN AEROBIC AND ANAEROBIC 5CC  Final   Culture   Final           BLOOD CULTURE RECEIVED NO GROWTH TO DATE CULTURE WILL BE HELD FOR 5  DAYS BEFORE ISSUING A FINAL NEGATIVE REPORT Performed at [facility]    Report Status PENDING  Incomplete  CSF culture     Status: None (Preliminary result)   Collection Time: 09/22/14 12:32 PM  Result Value Ref Range Status   Specimen Description CSF  Final   Special Requests NONE  Final   Gram Stain  Final    NO WBC SEEN NO ORGANISMS SEEN Performed at [facility]    Culture   Final    NO GROWTH 2 DAYS Performed at [facility]    Report Status PENDING  Incomplete     Labs: Basic Metabolic Panel:  Recent Labs Lab 09/22/14 0303 09/23/14 0405  NA 136 135  K 4.1 3.8  CL 103 103  CO2 24 25  GLUCOSE 107* 150*  BUN 18 29*  CREATININE 0.92 1.12  CALCIUM 9.0 8.3*   Liver Function Tests:  Recent Labs Lab 09/22/14 0303 09/23/14 0405  AST 23 18  ALT 18 14  ALKPHOS 68 54  BILITOT 1.7* 1.7*  PROT 6.3 5.3*  ALBUMIN 3.8 3.1*   No results for input(s): LIPASE, AMYLASE in the last 168 hours. No results for input(s): AMMONIA in the last 168 hours. CBC:  Recent Labs Lab 09/22/14 0303 09/23/14 0405 09/24/14 0915  WBC 23.0* 16.3* 8.2  NEUTROABS 20.0*  --   --   HGB 15.7 13.4 13.7  HCT 46.4 40.7 41.3  MCV 97.5 99.3 97.9  PLT 185 148* 148*   Cardiac Enzymes: No results for input(s): CKTOTAL, CKMB, CKMBINDEX, TROPONINI in the last 168 hours. BNP: BNP (last 3 results) No results for input(s): PROBNP in the last 8760 hours. CBG:  Recent Labs Lab 09/22/14 0250  GLUCAP 97       Signed:  Raynee Mccasland  [facility] 09/24/2014, 2:33 PM

## 2014-09-25 LAB — CSF CULTURE W GRAM STAIN: Culture: NO GROWTH

## 2014-09-25 LAB — CSF CULTURE: GRAM STAIN: NONE SEEN

## 2014-09-27 LAB — B. BURGDORFI ANTIBODIES: B burgdorferi Ab IgG+IgM: 0.32 {ISR}

## 2014-09-28 LAB — CULTURE, BLOOD (ROUTINE X 2)
CULTURE: NO GROWTH
Culture: NO GROWTH

## 2015-06-23 IMAGING — CT CT HEAD W/O CM
2 series · 15 of 30 positions shown, 19 images · non-contrast
Comparison: None.

CLINICAL DATA: Acute onset of altered mental status. Slurred
speech. Code stroke. Initial encounter.

EXAM:
CT HEAD WITHOUT CONTRAST
TECHNIQUE: Contiguous axial images were obtained from the base of the skull
through the vertex without intravenous contrast.

[Series 2: head w/o · axial · non-contrast · 0.49mm/px · z∈[-93,+37]mm · 13 of 32 slices shown, 17 images]
[im 3/32  brain]
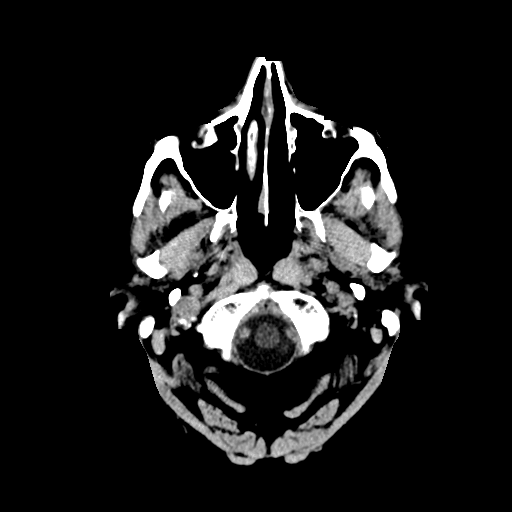
[im 3/32  bone]
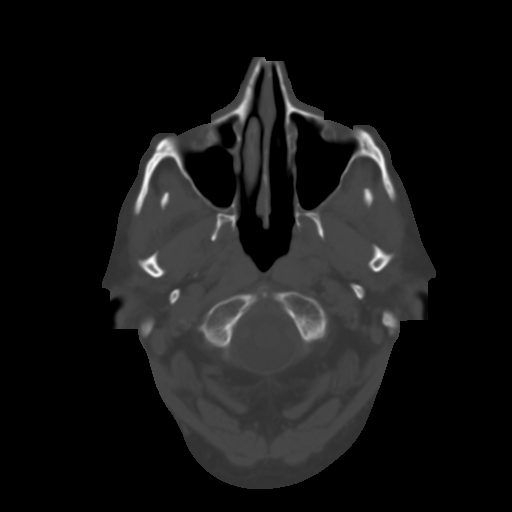
[im 5/32  brain]
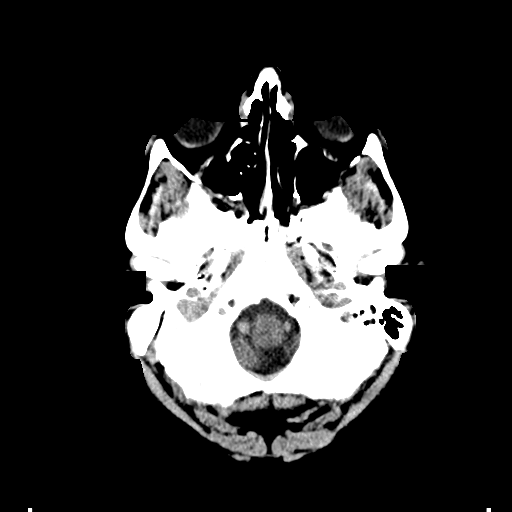
[im 7/32  brain]
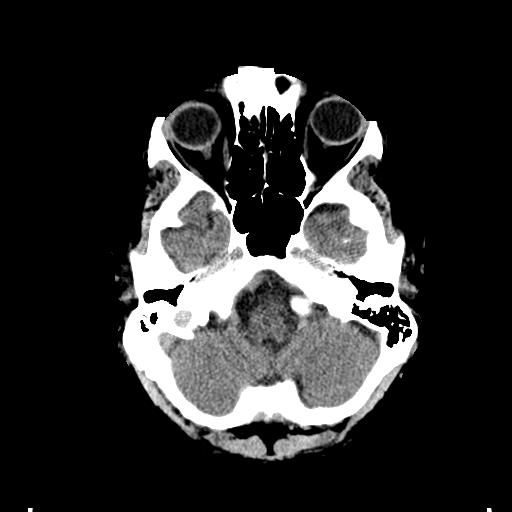
[im 9/32  brain]
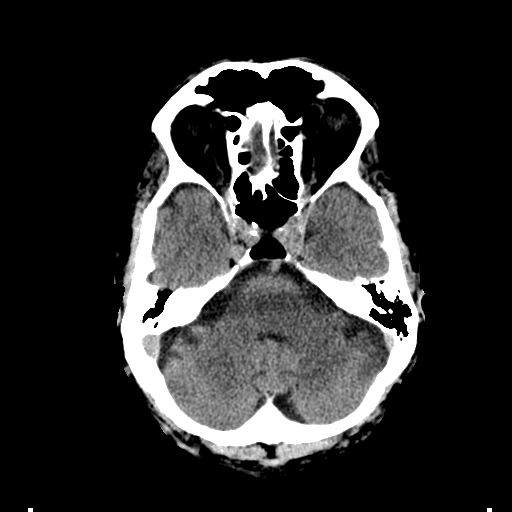
[im 12/32  brain]
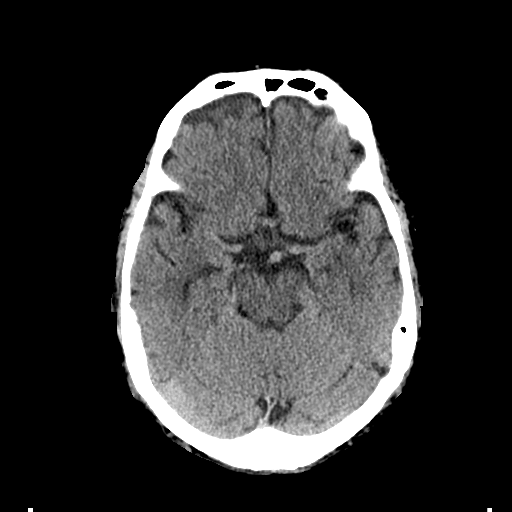
[im 12/32  bone]
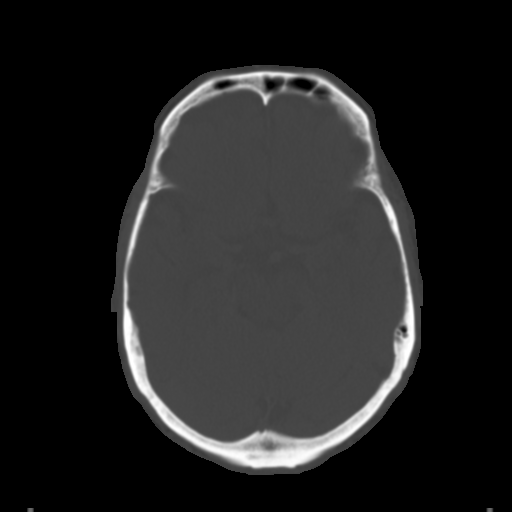
[im 14/32  brain]
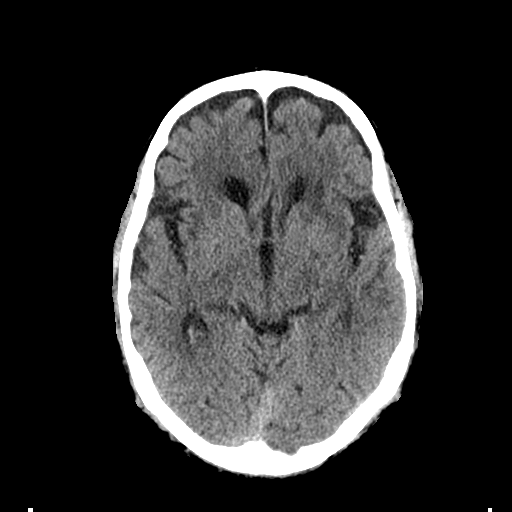
[im 16/32  brain]
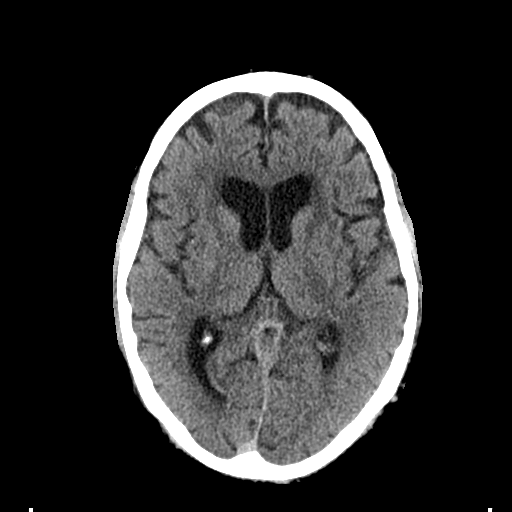
[im 18/32  brain]
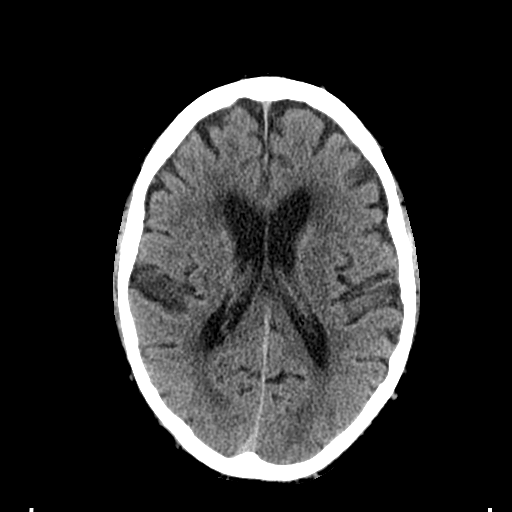
[im 20/32  brain]
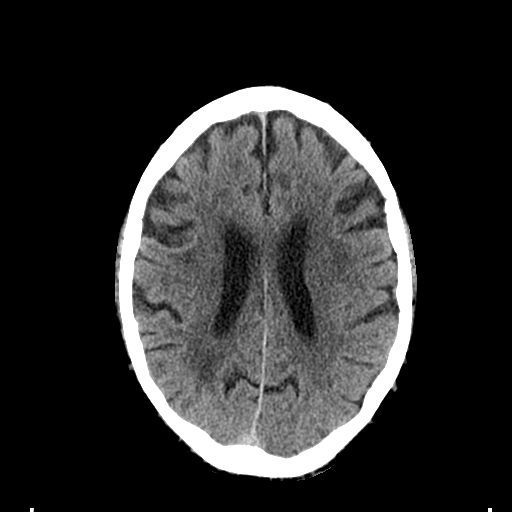
[im 20/32  bone]
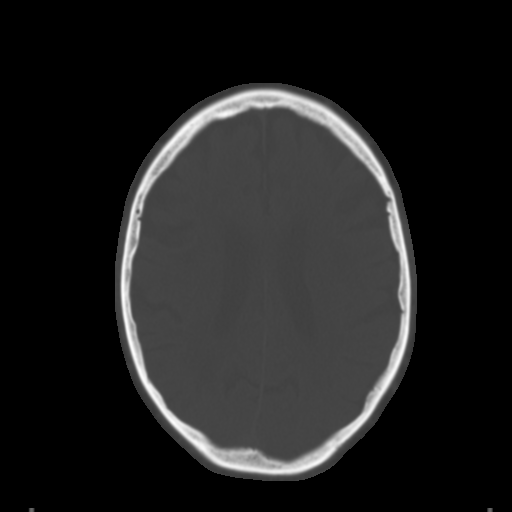
[im 23/32  brain]
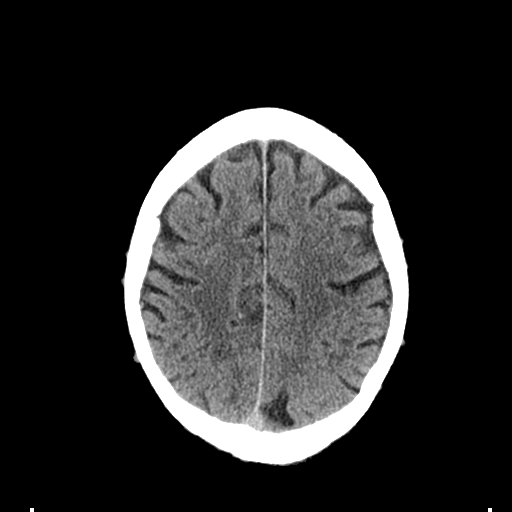
[im 25/32  brain]
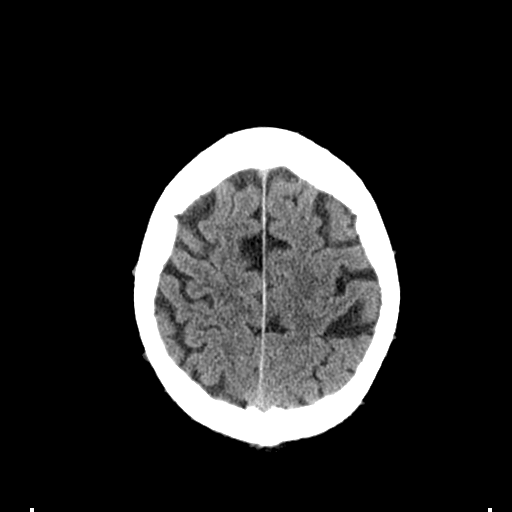
[im 27/32  brain]
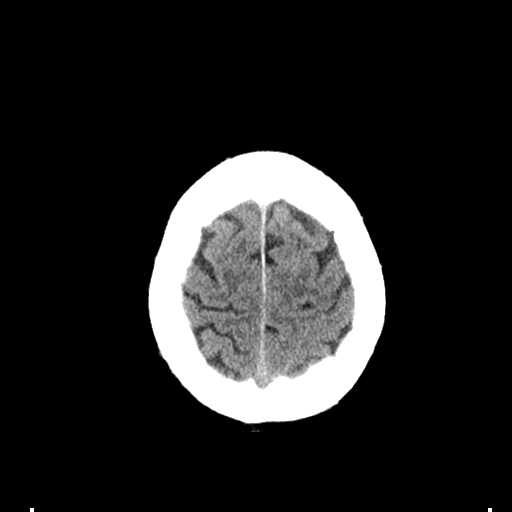
[im 29/32  brain]
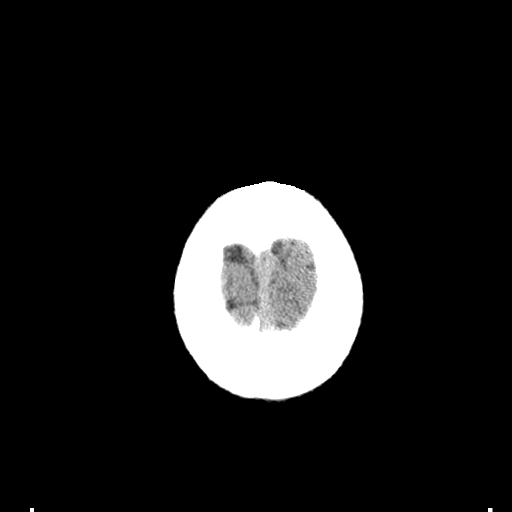
[im 29/32  bone]
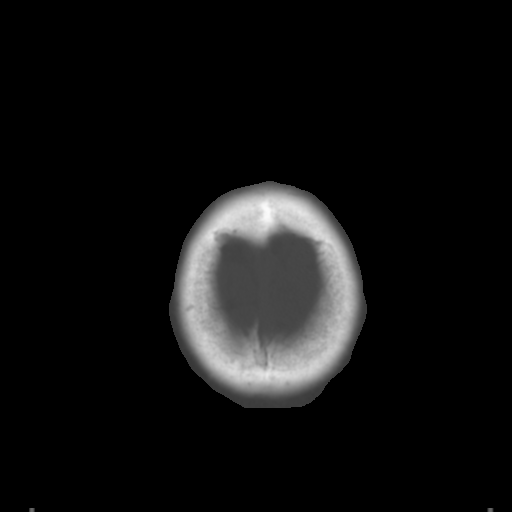

[Series 3: bone windows · axial · 0.49mm/px · z∈[-93,-73]mm · 2 of 32 slices shown]
[im 3/32  bone]
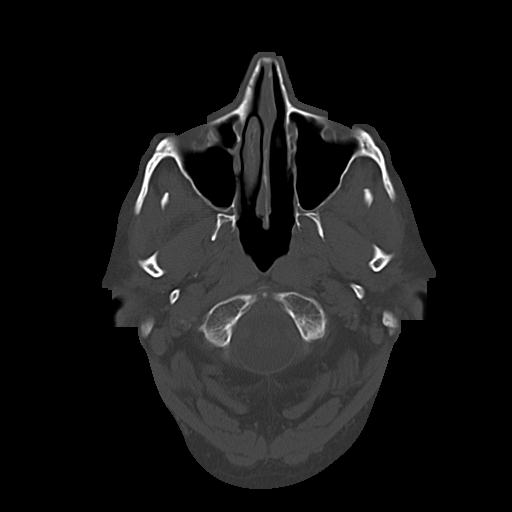
[im 7/32  bone]
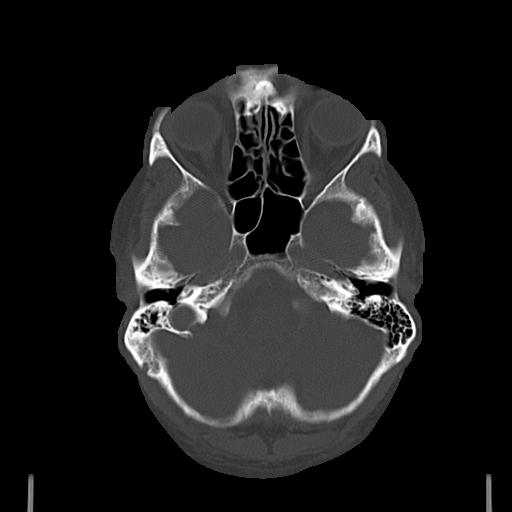

[15 of 30 positions shown; findings below may reference images not displayed]

FINDINGS: There is no evidence of acute infarction, mass lesion, or intra- or
extra-axial hemorrhage on CT.

Prominence of the ventricles and sulci reflects mild to moderate
cortical volume loss. Scattered periventricular and subcortical
white matter change likely reflects small vessel ischemic
microangiopathy. Cerebellar atrophy is noted.

The brainstem and fourth ventricle are within normal limits. The
basal ganglia are unremarkable in appearance. The cerebral
hemispheres demonstrate grossly normal gray-white differentiation.
No mass effect or midline shift is seen.

There is no evidence of fracture; visualized osseous structures are
unremarkable in appearance. The orbits are within normal limits. The
paranasal sinuses and mastoid air cells are well-aerated. No
significant soft tissue abnormalities are seen.
IMPRESSION: 1. No acute intracranial pathology seen on CT.
2. Mild to moderate cortical volume loss and scattered small vessel
ischemic microangiopathy.

These results were called by telephone at the time of interpretation
on 09/22/2014 at [DATE] to Dr. NCIRI GAYED, who verbally
acknowledged these results.

## 2021-09-17 DEATH — deceased
